# Patient Record
Sex: Male | Born: 1960 | Race: White | Hispanic: No | Marital: Single | Smoking: Never smoker
Health system: Southern US, Community
[De-identification: ages and names within clinical notes are randomized; demographics above are authoritative.]

## PROBLEM LIST (undated history)

## (undated) DIAGNOSIS — F329 Major depressive disorder, single episode, unspecified: Secondary | ICD-10-CM

## (undated) DIAGNOSIS — R55 Syncope and collapse: Secondary | ICD-10-CM

## (undated) DIAGNOSIS — G2581 Restless legs syndrome: Secondary | ICD-10-CM

## (undated) DIAGNOSIS — R202 Paresthesia of skin: Secondary | ICD-10-CM

## (undated) DIAGNOSIS — G479 Sleep disorder, unspecified: Secondary | ICD-10-CM

## (undated) DIAGNOSIS — I10 Essential (primary) hypertension: Secondary | ICD-10-CM

## (undated) DIAGNOSIS — F32A Depression, unspecified: Secondary | ICD-10-CM

## (undated) DIAGNOSIS — F419 Anxiety disorder, unspecified: Secondary | ICD-10-CM

## (undated) HISTORY — PX: TOTAL HIP ARTHROPLASTY: SHX124

## (undated) HISTORY — DX: Anxiety disorder, unspecified: F41.9

## (undated) HISTORY — DX: Paresthesia of skin: R20.2

## (undated) HISTORY — DX: Sleep disorder, unspecified: G47.9

---

## 1988-09-12 HISTORY — PX: KNEE RECONSTRUCTION: SHX5883

## 1999-09-11 ENCOUNTER — Encounter: Admission: RE | Admit: 1999-09-11 | Discharge: 1999-09-11 | Payer: Self-pay | Admitting: Orthopedic Surgery

## 1999-09-11 ENCOUNTER — Encounter: Payer: Self-pay | Admitting: Orthopedic Surgery

## 2003-04-19 ENCOUNTER — Ambulatory Visit (HOSPITAL_COMMUNITY): Admission: RE | Admit: 2003-04-19 | Discharge: 2003-04-19 | Payer: Self-pay | Admitting: Family Medicine

## 2006-09-27 ENCOUNTER — Ambulatory Visit: Payer: Self-pay | Admitting: Internal Medicine

## 2006-09-27 LAB — CONVERTED CEMR LAB
Alkaline Phosphatase: 95 units/L (ref 39–117)
BUN: 11 mg/dL (ref 6–23)
CO2: 23 meq/L (ref 19–32)
Creatinine, Ser: 1.02 mg/dL (ref 0.40–1.50)
Glucose, Bld: 84 mg/dL (ref 70–99)
LDL Cholesterol: 158 mg/dL — ABNORMAL HIGH (ref 0–99)
Potassium: 4.5 meq/L (ref 3.5–5.3)
Sodium: 143 meq/L (ref 135–145)
Total CHOL/HDL Ratio: 4.3
VLDL: 38 mg/dL (ref 0–40)

## 2006-09-28 ENCOUNTER — Ambulatory Visit (HOSPITAL_COMMUNITY): Admission: RE | Admit: 2006-09-28 | Discharge: 2006-09-28 | Payer: Self-pay | Admitting: Internal Medicine

## 2006-10-13 ENCOUNTER — Ambulatory Visit: Payer: Self-pay | Admitting: *Deleted

## 2006-10-13 ENCOUNTER — Ambulatory Visit: Payer: Self-pay | Admitting: Internal Medicine

## 2006-11-25 ENCOUNTER — Ambulatory Visit: Payer: Self-pay | Admitting: Internal Medicine

## 2006-12-07 ENCOUNTER — Ambulatory Visit (HOSPITAL_COMMUNITY): Admission: RE | Admit: 2006-12-07 | Discharge: 2006-12-07 | Payer: Self-pay | Admitting: Internal Medicine

## 2007-01-03 ENCOUNTER — Ambulatory Visit: Payer: Self-pay | Admitting: Internal Medicine

## 2007-01-03 LAB — CONVERTED CEMR LAB
ALT: 64 units/L — ABNORMAL HIGH (ref 0–53)
AST: 43 units/L — ABNORMAL HIGH (ref 0–37)
Albumin: 4.5 g/dL (ref 3.5–5.2)
Alkaline Phosphatase: 91 units/L (ref 39–117)
Basophils Relative: 1 % (ref 0–1)
CO2: 24 meq/L (ref 19–32)
Calcium: 9.6 mg/dL (ref 8.4–10.5)
Creatinine, Ser: 1.06 mg/dL (ref 0.40–1.50)
Eosinophils Absolute: 0.1 10*3/uL — ABNORMAL LOW (ref 0.2–0.7)
Eosinophils Relative: 1 % (ref 0–5)
HCT: 47.5 % (ref 39.0–52.0)
Hemoglobin: 15.9 g/dL (ref 13.0–17.0)
Lymphocytes Relative: 31 % (ref 12–46)
Monocytes Absolute: 0.6 10*3/uL (ref 0.1–1.0)
Monocytes Relative: 13 % — ABNORMAL HIGH (ref 3–12)
Neutro Abs: 2.4 10*3/uL (ref 1.7–7.7)
Potassium: 4.5 meq/L (ref 3.5–5.3)
RBC: 4.76 M/uL (ref 4.22–5.81)
TSH: 3.613 microintl units/mL (ref 0.350–5.50)

## 2007-02-08 ENCOUNTER — Ambulatory Visit (HOSPITAL_COMMUNITY): Admission: RE | Admit: 2007-02-08 | Discharge: 2007-02-08 | Payer: Self-pay | Admitting: Family Medicine

## 2007-03-01 ENCOUNTER — Ambulatory Visit: Payer: Self-pay | Admitting: Internal Medicine

## 2007-04-06 ENCOUNTER — Ambulatory Visit: Payer: Self-pay | Admitting: Internal Medicine

## 2007-04-14 ENCOUNTER — Encounter: Admission: RE | Admit: 2007-04-14 | Discharge: 2007-04-14 | Payer: Self-pay | Admitting: Internal Medicine

## 2007-05-03 ENCOUNTER — Encounter: Admission: RE | Admit: 2007-05-03 | Discharge: 2007-05-03 | Payer: Self-pay | Admitting: Internal Medicine

## 2007-05-16 ENCOUNTER — Encounter: Admission: RE | Admit: 2007-05-16 | Discharge: 2007-05-16 | Payer: Self-pay | Admitting: Internal Medicine

## 2007-06-01 ENCOUNTER — Ambulatory Visit: Payer: Self-pay | Admitting: Internal Medicine

## 2007-08-05 ENCOUNTER — Inpatient Hospital Stay (HOSPITAL_COMMUNITY): Admission: RE | Admit: 2007-08-05 | Discharge: 2007-08-08 | Payer: Self-pay | Admitting: Orthopedic Surgery

## 2007-10-20 ENCOUNTER — Emergency Department (HOSPITAL_COMMUNITY): Admission: EM | Admit: 2007-10-20 | Discharge: 2007-10-20 | Payer: Self-pay | Admitting: Emergency Medicine

## 2009-03-01 ENCOUNTER — Encounter: Admission: RE | Admit: 2009-03-01 | Discharge: 2009-03-01 | Payer: Self-pay | Admitting: Orthopedic Surgery

## 2009-11-12 ENCOUNTER — Ambulatory Visit (HOSPITAL_COMMUNITY): Admission: RE | Admit: 2009-11-12 | Discharge: 2009-11-12 | Payer: Self-pay | Admitting: Family Medicine

## 2009-11-22 ENCOUNTER — Encounter (INDEPENDENT_AMBULATORY_CARE_PROVIDER_SITE_OTHER): Payer: Self-pay | Admitting: *Deleted

## 2009-11-22 LAB — CONVERTED CEMR LAB
ALT: 55 units/L — ABNORMAL HIGH (ref 0–53)
AST: 42 units/L — ABNORMAL HIGH (ref 0–37)
Alkaline Phosphatase: 82 units/L (ref 39–117)
Cholesterol: 256 mg/dL — ABNORMAL HIGH (ref 0–200)
Cocaine Metabolites: NEGATIVE
Creatinine, Ser: 0.95 mg/dL (ref 0.40–1.50)
Creatinine,U: 167.1 mg/dL
LDL Cholesterol: 171 mg/dL — ABNORMAL HIGH (ref 0–99)
Methadone: NEGATIVE
Propoxyphene: NEGATIVE
Sodium: 142 meq/L (ref 135–145)
Total Bilirubin: 0.4 mg/dL (ref 0.3–1.2)
Total CHOL/HDL Ratio: 4.7
VLDL: 31 mg/dL (ref 0–40)

## 2009-11-28 ENCOUNTER — Encounter (INDEPENDENT_AMBULATORY_CARE_PROVIDER_SITE_OTHER): Payer: Self-pay | Admitting: Internal Medicine

## 2009-11-28 LAB — CONVERTED CEMR LAB
HCV Ab: NEGATIVE
Hep B Core Total Ab: NEGATIVE

## 2009-12-01 ENCOUNTER — Encounter: Admission: RE | Admit: 2009-12-01 | Discharge: 2009-12-01 | Payer: Self-pay | Admitting: Family Medicine

## 2009-12-18 ENCOUNTER — Ambulatory Visit (HOSPITAL_COMMUNITY)
Admission: RE | Admit: 2009-12-18 | Discharge: 2009-12-18 | Payer: Self-pay | Source: Home / Self Care | Attending: Family Medicine | Admitting: Family Medicine

## 2010-02-02 ENCOUNTER — Encounter: Payer: Self-pay | Admitting: Internal Medicine

## 2010-02-06 ENCOUNTER — Encounter (INDEPENDENT_AMBULATORY_CARE_PROVIDER_SITE_OTHER): Payer: Self-pay | Admitting: *Deleted

## 2010-02-06 LAB — CONVERTED CEMR LAB
AST: 27 units/L (ref 0–37)
Albumin: 4.5 g/dL (ref 3.5–5.2)
Alkaline Phosphatase: 85 units/L (ref 39–117)
Bilirubin, Direct: 0.1 mg/dL (ref 0.0–0.3)
Indirect Bilirubin: 0.5 mg/dL (ref 0.0–0.9)
Total Bilirubin: 0.6 mg/dL (ref 0.3–1.2)

## 2010-04-22 ENCOUNTER — Ambulatory Visit: Payer: Self-pay | Admitting: *Deleted

## 2010-05-27 NOTE — Op Note (Signed)
**Note Micheal Hunter** [name], [name]               ACCOUNT NO.:  [id number]   MEDICAL RECORD NO.:  [id number]          PATIENT TYPE:  INP   LOCATION:  0001                         FACILITY:  [facility]   PHYSICIAN:  Madlyn Frankel. Charlann Boxer, M.D.  DATE OF BIRTH:  July 09, 1960   DATE OF PROCEDURE:  08/05/2007  DATE OF DISCHARGE:                               OPERATIVE REPORT   PREOPERATIVE DIAGNOSIS:  Right hip avascular necrosis.   POSTOPERATIVE DIAGNOSIS:  Right hip avascular necrosis.   PROCEDURE:  Right total hip replacement.   COMPONENTS USED:  DePuy hip system size 52 ASR 6 high Trilock stem with  a 46 +2 ASR ball and adapter.   SURGEON:  Madlyn Frankel. Charlann Boxer, M.D.   ASSISTANT:  Yetta Glassman. Loreta Ave, PA   ANESTHESIA:  General.   BLOOD LOSS:  400.   DRAINS:  x1.   COMPLICATIONS:  None.   INDICATIONS FOR PROCEDURE:  Mr. Micheal Hunter is a 50 year old gentleman who  presented with a 2 year history of increasing worsening right hip pain  with severe radiographic changes consistent with avascular necrosis.  An  MRI had been performed outside and confirmed this diagnosis and also  indicating stage 1 AVN on the left which was currently asymptomatic.  After reviewing this pathology surgical options were reviewed.  Consent  was obtained for the benefit of pain relief.  After reviewing  significant risks of infection, DVT, component failure, dislocation and  need for revision as well as his age and bearing surface and options  available consent was obtained.   PROCEDURE IN DETAIL:  The patient was brought to the operative theater.  Once adequate anesthesia preoperative antibiotics, Ancef 2 grams, were  administered.  The patient was positioned in the left lateral decubitus  position right side up.  The right lower extremity was subsequently pre  scrubbed and prepped and draped in a sterile fashion.  Lateral based  incision was made for posterior approach to the hip.  The iliotibial  band and gluteal fascia incised  posteriorly.  The short external  rotators were identified and taken down separately from the posterior  capsule.  An L capsulotomy was made exposing the hip joint.  The hip was  dislocated.  Severe avascular changes were noted with collapse of the  femoral head and flattening.  Neck osteotomy was made in the  trochanteric fossa.  Retractors were placed and attention directed first  to the femur.  Using a box osteotome I cleared out the lateral neck and  in varus position then used a hand reamer and then irrigated the canal  to prevent fat emboli.  I then began broaching with a size 1 broach  setting my anteversion at 20 degrees.  I broached all the way up to a  size 5 initially and used a calcar planer to plane off the medial neck.  I packed off the femur attendant to the acetabulum.   Retractors were placed.  Significant synovitis was debrided.  Labrum  removed including the significant posterolateral osteophyte helping to  identify boundaries of the acetabulum.  I then began reaming with a  46  reamer down to the medial wall and sequentially reamed up to a 51 reamer  with excellent bony bed preparation.  I marked and identified the  anterior aspect of the acetabulum for landmarks and placement of final  cup.  A final 52 ASR cup with a curved impactor was subsequently  impacted into position at approximately 4 degrees of abduction using the  guide on the impactor.  Once I impacted at appropriate level based on  the identification marking anteriorly we then checked rechecked the  guide to confirm position.  Attention was now directed to a trial  reduction.   The size 5 broach was placed with a +2 adapter and 46 ball.  Hip  stability was very good, tolerated internal rotation to 70 degrees and  neutral abduction and flexion to 80 degrees in addition to the sleep  position with the leg abducted and internally rotated.  There were a few  millimeters of shuck.  For this reason I went ahead  and removed the  trial components and broached to a size 6 which the level of my neck cut  had been made whereas the 5 broach just had a couple of millimeters  subsided.  We chose the final 6 high Trilock stem and impacted this to  the level where the broach sat.  I retrialed the +2 adapter.  With this,  there was only a millimeter of  shuck in extension.  The leg lengths  appeared to be comparable and range of motion of hip still remained  excellent.   The final 46 ball and +2 adapter were impacted on the back table and  then impacted onto a dry trunnion.  Hip was irrigated.  The hip reduced.  We irrigated the hip throughout the case again at this point.  I did not  reapproximate the posterior capsule due to debridement necessary for the  osteophytes present.  I reapproximated the iliotibial band and gluteal  fascia over a medium Hemovac drain.  The remaining wound was closed with  2-0 Vicryl and running 4-0 Monocryl.  The hip was cleaned, dried and  dressed sterilely with Steri-Strips and sterile dressing and he was  brought to the recovery room extubated and in stable condition.      Madlyn Frankel Charlann Boxer, M.D.  Electronically Signed     MDO/MEDQ  D:  08/05/2007  T:  08/05/2007  Job:  [id number]

## 2010-05-27 NOTE — H&P (Signed)
Micheal Hunter, Micheal Hunter               ACCOUNT NO.:  [id number]   MEDICAL RECORD NO.:  [id number]          PATIENT TYPE:  INP   LOCATION:  NA                           FACILITY:  [facility]   PHYSICIAN:  Madlyn Frankel. Charlann Boxer, M.D.  DATE OF BIRTH:  Oct 28, 1960   DATE OF ADMISSION:  08/05/2007  DATE OF DISCHARGE:                              HISTORY & PHYSICAL   PROCEDURE:  Will be a right total hip arthroplasty.   CHIEF COMPLAINTS:  Right hip pain.   HISTORY OF PRESENT ILLNESS:  A 50 year old male with a history of right  hip pain secondary to AVN stage IV.  It has been refractory to all  conservative treatment.  He has had a diminished quality of life,  intractable pain, and has to walk with assistance.  He has been followed  and provided vocational rehab through the assistance by Dr. Reche Dixon.   PAST MEDICAL HISTORY:  Significant for AVN.   SURGICAL HISTORY:  Right knee scope by Dr. Rennis Chris in the past.   FAMILY HISTORY:  Noncontributory.   SOCIAL HISTORY:  He is divorced but does have a primary caregiver lined  up at home postoperatively.   DRUG ALLERGIES:  No known drug allergies.   MEDICATIONS:  None.   REVIEW OF SYSTEMS:  GENERAL:  He has increase in weight recently.  DERMATOLOGY:  He has itching that he has on his left arm.  GENITOURINARY:  Has issues with urinary frequency, urinating at night.  MUSCULOSKELETAL:  Has joint pain, back pain and morning stiffness.  Otherwise see HPI.   PHYSICAL EXAMINATION:  This is a 6 feet, 285-pound male.  VITAL SIGNS:  Pulse 72, respirations 16, blood pressure 148/100.  GENERAL:  Awake, alert and oriented, well-developed, well-nourished, no  acute distress.  NECK:  Supple.  No carotid bruits.  CHEST/LUNGS:  Clear to auscultation bilaterally.  BREASTS:  Deferred.  HEART:  Regular rate and rhythm.  S1, S2 distinct.  ABDOMEN:  Soft, nontender, nondistended.  Bowel sounds present.  GENITOURINARY:  Deferred.  EXTREMITIES:  Right hip has decreased  range of motion and increased  pain.  SKIN:  No cellulitis of the right hip.  He does have a rash on his left  arm where he has scratched the top layer of epidermis off.  NEUROLOGIC:  Intact distal sensibilities.   LABS:  EKG, chest x-ray all pending, presurgical testing.   IMPRESSION:  Right hip AVN stage IV.   PLAN OF ACTION:  Right total hip arthroplasty at [facility]  August 05, 2007 by surgeon Dr. Durene Romans.  Risks and complications were  discussed.   Postoperative medications including Lovenox, Robaxin, iron, aspirin,  Colace, MiraLax provided at time of history of physical.  Pain medicine  will be provided at the time of surgery.     ______________________________  Yetta Glassman Loreta Ave, [place]      Madlyn Frankel. Charlann Boxer, M.D.  Electronically Signed    BLM/MEDQ  D:  08/01/2007  T:  08/01/2007  Job:  [id number]   cc:   Dineen Kid. Reche Dixon, M.D.  Fax: [phone]

## 2010-05-30 NOTE — Discharge Summary (Signed)
NAMELENIS, Micheal Hunter               ACCOUNT NO.:  [id number]   MEDICAL RECORD NO.:  [id number]          PATIENT TYPE:  INP   LOCATION:  1602                         FACILITY:  [facility]   PHYSICIAN:  Madlyn Frankel. Charlann Boxer, M.D.  DATE OF BIRTH:  September 02, 1960   DATE OF ADMISSION:  08/05/2007  DATE OF DISCHARGE:  08/08/2007                               DISCHARGE SUMMARY   ADMITTING DIAGNOSES:  1. Avascular necrosis.  2. Osteoarthritis.   DISCHARGE DIAGNOSES:  1. Avascular necrosis.  2. Osteoarthritis.   HISTORY OF PRESENT ILLNESS:  A 50 year old male with a history of right  hip pain secondary to avascular necrosis, stage IV.  Refractory to all  conservative treatment.  Diminished quality of life, intractable pain,  difficulty with ambulation.  Presurgically assessed by Dr. Reche Dixon.   CONSULTATION:  None.   PROCEDURE:  Was right total hip arthroplasty by surgeon Dr. Durene Romans.  Assistant Dwyane Luo, PA-C.   LABORATORY DATA:  Upon admission, CBC:  Hemoglobin 15.7, hematocrit  44.9, platelets 138; at time of discharge, hemoglobin 11.8, hematocrit  33.8, platelets 117.  White cell differential all within normal limits.  Coagulation:  His PTT was 22; otherwise, all others normal.  Routine  chemistry upon admission:  No significant abnormalities.  At the time of  discharge, his sodium was 135, potassium 3.7, glucose 149, BUN 4,  creatinine 0.8.  Kidney function all normal.  Calcium 8.4 at time of  discharge.  UA was negative.   CARDIOLOGY:  EKG showed normal sinus rhythm.   RADIOLOGY:  Portable pelvis postoperatively showed severe osteonecrosis  of the right femoral head, secondary to osteoarthritis and slight  remodeling of the acetabulum.   No chest x-ray found on chart.   HOSPITAL COURSE:  The patient underwent a right total hip placement,  admitted to orthopedic floor.  The patient did well while in the  hospital.  His stay was unremarkable.  He remained afebrile,  hemodynamically  stable.  Hemovac was discontinued on day 1.  Left hip  wound showed no significant drainage with dressing changes on a daily  basis.  He made good progress with his physical therapy, occupational  therapy, and prior to discharge he met all functional criteria for home  health care PT prior to discharge.   Seen on day 3, was doing well, afebrile.  Dressing was dry.  Neurovascular stable on this right lower extremity.   DISCHARGE DISPOSITION:  Discharged home with home health care physical  therapy in stable and improved condition.   DISCHARGE PHYSICAL THERAPY:  Weightbearing as tolerated with use of  rolling walker.   DISCHARGE DIET:  Regular.   DISCHARGE WOUND CARE:  Keep dry.   DISCHARGE MEDICATIONS:  1. Lovenox 40 mg subcutaneous q.24h. for 11 days.  2. Robaxin 500 mg p.o. q.6h.  3. Iron 325 mg 1 p.o. t.i.d. for 3 weeks.  4. Colace 100 mg p.o. daily.  5. MiraLax 17 grams p.o. daily.  6. Enteric-coated aspirin 325 mg 1 p.o. daily for 4 weeks after      Lovenox completed.  7. Norco 7.5/325 one  to two p.o. q.4-6h. p.r.n. pain.   DISCHARGE FOLLOWUP:  Follow up with Dr. Charlann Boxer at phone number [phone] in  2 weeks for wound check.     ______________________________  Yetta Glassman. Loreta Ave, [place]      Madlyn Frankel. Charlann Boxer, M.D.  Electronically Signed    BLM/MEDQ  D:  08/30/2007  T:  08/30/2007  Job:  [id number]   cc:   Dineen Kid. Reche Dixon, M.D.  Fax: [phone]

## 2010-10-10 LAB — BASIC METABOLIC PANEL
BUN: 11
BUN: 3 — ABNORMAL LOW
BUN: 4 — ABNORMAL LOW
CO2: 30
CO2: 31
Calcium: 8.1 — ABNORMAL LOW
Calcium: 8.4
Calcium: 9.5
Chloride: 100
Creatinine, Ser: 0.8
GFR calc Af Amer: >60
GFR calc non Af Amer: >60
GFR calc non Af Amer: >60
GFR calc non Af Amer: >60
Glucose, Bld: 118 — ABNORMAL HIGH
Glucose, Bld: 149 — ABNORMAL HIGH
Sodium: 135

## 2010-10-10 LAB — URINALYSIS, ROUTINE W REFLEX MICROSCOPIC
Glucose, UA: NEGATIVE
Hgb urine dipstick: NEGATIVE
Ketones, ur: NEGATIVE
Nitrite: NEGATIVE

## 2010-10-10 LAB — DIFFERENTIAL
Basophils Relative: 1
Eosinophils Absolute: 0.1
Lymphs Abs: 1.1
Monocytes Absolute: 0.4
Monocytes Relative: 10

## 2010-10-10 LAB — CBC
HCT: 36.1 — ABNORMAL LOW
HCT: 44.9
Hemoglobin: 12.4 — ABNORMAL LOW
MCHC: 34.4
MCHC: 34.8
MCV: 98.1
MCV: 99.4
Platelets: 117 — ABNORMAL LOW
RBC: 4.58
RDW: 13.2
RDW: 13.9

## 2010-10-10 LAB — ABO/RH: ABO/RH(D): A POS

## 2012-11-03 ENCOUNTER — Emergency Department (HOSPITAL_COMMUNITY): Payer: Medicare Other

## 2012-11-03 ENCOUNTER — Encounter (HOSPITAL_COMMUNITY): Payer: Self-pay | Admitting: Emergency Medicine

## 2012-11-03 ENCOUNTER — Emergency Department (HOSPITAL_COMMUNITY)
Admission: EM | Admit: 2012-11-03 | Discharge: 2012-11-03 | Disposition: A | Discharge status: Home / Self Care | Payer: Medicare Other | Attending: Emergency Medicine | Admitting: Emergency Medicine

## 2012-11-03 DIAGNOSIS — Z8669 Personal history of other diseases of the nervous system and sense organs: Secondary | ICD-10-CM | POA: Insufficient documentation

## 2012-11-03 DIAGNOSIS — M25519 Pain in unspecified shoulder: Secondary | ICD-10-CM | POA: Insufficient documentation

## 2012-11-03 DIAGNOSIS — E860 Dehydration: Secondary | ICD-10-CM | POA: Insufficient documentation

## 2012-11-03 DIAGNOSIS — R55 Syncope and collapse: Secondary | ICD-10-CM | POA: Insufficient documentation

## 2012-11-03 DIAGNOSIS — M542 Cervicalgia: Secondary | ICD-10-CM | POA: Insufficient documentation

## 2012-11-03 DIAGNOSIS — E871 Hypo-osmolality and hyponatremia: Secondary | ICD-10-CM | POA: Insufficient documentation

## 2012-11-03 DIAGNOSIS — I10 Essential (primary) hypertension: Secondary | ICD-10-CM | POA: Insufficient documentation

## 2012-11-03 HISTORY — DX: Essential (primary) hypertension: I10

## 2012-11-03 HISTORY — DX: Restless legs syndrome: G25.81

## 2012-11-03 LAB — POCT I-STAT TROPONIN I

## 2012-11-03 LAB — BASIC METABOLIC PANEL
CO2: 23 mEq/L (ref 19–32)
Calcium: 8 mg/dL — ABNORMAL LOW (ref 8.4–10.5)
Glucose, Bld: 110 mg/dL — ABNORMAL HIGH (ref 70–99)
Potassium: 4.2 mEq/L (ref 3.5–5.1)
Sodium: 127 mEq/L — ABNORMAL LOW (ref 135–145)

## 2012-11-03 LAB — CBC
Hemoglobin: 14.4 g/dL (ref 13.0–17.0)
Platelets: 80 10*3/uL — ABNORMAL LOW (ref 150–400)
RBC: 4.25 MIL/uL (ref 4.22–5.81)
WBC: 5.9 10*3/uL (ref 4.0–10.5)

## 2012-11-03 LAB — GLUCOSE, CAPILLARY: Glucose-Capillary: 113 mg/dL — ABNORMAL HIGH (ref 70–99)

## 2012-11-03 MED ORDER — SODIUM CHLORIDE 0.9 % IV BOLUS (SEPSIS)
1000.0000 mL | Freq: Once | INTRAVENOUS | Status: AC
  Administered 2012-11-03: 1000 mL via INTRAVENOUS

## 2012-11-03 NOTE — ED Provider Notes (Signed)
CSN: [id number]     Arrival date & time 11/03/12  1835 History   First MD Initiated Contact with Patient 11/03/12 1843     Chief Complaint  Patient presents with  . Loss of Consciousness    Patient is a 52 y.o. male presenting with syncope. The history is provided by the patient.  Loss of Consciousness Episode history:  Multiple Most recent episode:  Today Duration:  1 minute Progression:  Resolved Chronicity:  New Witnessed: yes   Relieved by:  Lying down Worsened by:  Posture Associated symptoms: no chest pain, no difficulty breathing, no fever, no focal sensory loss, no focal weakness, no headaches, no rectal bleeding, no seizures, no shortness of breath, no vomiting and no weakness   pt reports he was eating dinner (cheezeburger) stood up and passed out.  It is reported he was out for one minute and had urinary incontinence.  No seizure reported.  No tongue biting.  No excessive etoh/drug abuse.  No HA.  No head injury.  No neck or back pain. No chest pain No h/o CAD/CHF No abd pain No vomiitng/diarrhea He reports just prior to episode he had mild neck and shoulder pain that is something he has had before No weakness/dizziness upon head movement However upon sitting up he had two more syncopal episodes  Past Medical History  Diagnosis Date  . Hypertension   . Restless leg    Past Surgical History  Procedure Laterality Date  . Total hip arthroplasty Right   . Knee reconstruction Right    No family history on file. History  Substance Use Topics  . Smoking status: Never Smoker   . Smokeless tobacco: Never Used  . Alcohol Use: 6.0 oz/week    10 Cans of beer per week     Comment: I have a couple of beers a day    Review of Systems  Constitutional: Negative for fever.  Respiratory: Negative for shortness of breath.   Cardiovascular: Positive for syncope. Negative for chest pain.  Gastrointestinal: Negative for vomiting, diarrhea and blood in stool.  Neurological:  Positive for syncope. Negative for focal weakness, seizures, weakness and headaches.  All other systems reviewed and are negative.    Allergies  Review of patient's allergies indicates no known allergies.  Home Medications  No current outpatient prescriptions on file. BP 101/61  Pulse 76  Temp(Src) 97.9 F (36.6 C) (Oral)  Resp 18  Ht 6' (1.829 m)  Wt 300 lb (136.079 kg)  BMI 40.68 kg/m2  SpO2 98% BP 131/89  Pulse 81  Temp(Src) 97.9 F (36.6 C) (Oral)  Resp 18  Ht 6' (1.829 m)  Wt 300 lb (136.079 kg)  BMI 40.68 kg/m2  SpO2 98%  Physical Exam CONSTITUTIONAL: Well developed/well nourished HEAD: Normocephalic/atraumatic, no signs of trauma or tenderness EYES: EOMI/PERRL ENMT: Mucous membranes moist, No evidence of facial/nasal trauma No tongue laceration NECK: supple no meningeal signs, NO BRUITS NOTED SPINE:entire spine nontender, No bruising/crepitance/stepoffs noted to spine CV: S1/S2 noted, no murmurs/rubs/gallops noted LUNGS: Lungs are clear to auscultation bilaterally, no apparent distress ABDOMEN: soft, nontender, no rebound or guarding GU:no cva tenderness NEURO: Pt is awake/alert, moves all extremitiesx4, GCS 15, no arm/leg weakness.  No facial droop EXTREMITIES: pulses normal, full ROM, All extremities/joints palpated/ranged and nontender SKIN: warm, color normal PSYCH: no abnormalities of mood noted  ED Course  Procedures  Labs Review Labs Reviewed  GLUCOSE, CAPILLARY - Abnormal; Notable for the following:    Glucose-Capillary 113 (*)  All other components within normal limits  CBC  BASIC METABOLIC PANEL     EKG Interpretation     Ventricular Rate:  78 PR Interval:  172 QRS Duration: 121 QT Interval:  488 QTC Calculation: 556 R Axis:   -10 Text Interpretation:  Sinus rhythm Nonspecific intraventricular conduction delay Anteroseptal infarct, age indeterminate nml intervals Non-specific ST-t changes             7:26 PM Pt with  unprovoked syncopal episode Denies new medications He had otherwise been feeling well Continue to monitor and will give IV fluids 8:45 PM VITALS ARE IMPROVING, PT STABLE   Pt observed for several hours He felt improved, he was ambulatory and his vitals improved He is dehydrated.  Suspect some of this is from his HTN meds He continues to deny CP/SOB I did discuss possibility of admission for observation and would benefit from tele monitoring and also cardiac echo. He preferred to go home and f/u with PCP.  He will stop his HCTZ for now.  We discussed strict return precautions MDM  No diagnosis found. Nursing notes including past medical history and social history reviewed and considered in documentation Labs/vital reviewed and considered     Joya Gaskins, MD 11/04/12 0011

## 2012-11-03 NOTE — ED Notes (Signed)
Phlebotomy at bedside.

## 2012-11-03 NOTE — ED Notes (Signed)
Pt was able to ambulate with no issues 

## 2012-11-03 NOTE — ED Notes (Signed)
Lab and Phlebotomy contacted regarding delay in troponin result. EDP and Pt updated

## 2012-11-03 NOTE — ED Notes (Signed)
Pt with steady gait, ambulatory to waiting room with this RN. Pt provided with telephone to contact a family member for a ride home.

## 2012-11-03 NOTE — ED Notes (Signed)
Dr. Wickline at bedside.  

## 2012-11-03 NOTE — ED Notes (Addendum)
Pt brought from home via EMS for syncopal episodes. Pt had syncopal episode with incontinence. EMS arrived on scene and initial BP of 80/50, upon sitting pt up x2 pt had 2 more syncopal episodes. Pt given 2L [place] PTA and placed on 2L [place]. Pt not on [place] at this time and SpO2 of 99%

## 2012-11-03 NOTE — ED Notes (Signed)
Unable to obtain lab draw. Phlebotomy notified

## 2012-12-19 ENCOUNTER — Encounter: Payer: Self-pay | Admitting: Neurology

## 2012-12-20 ENCOUNTER — Ambulatory Visit (INDEPENDENT_AMBULATORY_CARE_PROVIDER_SITE_OTHER): Payer: Medicare Other | Admitting: Neurology

## 2012-12-20 ENCOUNTER — Encounter (INDEPENDENT_AMBULATORY_CARE_PROVIDER_SITE_OTHER): Payer: Self-pay

## 2012-12-20 ENCOUNTER — Encounter: Payer: Self-pay | Admitting: Neurology

## 2012-12-20 ENCOUNTER — Telehealth: Payer: Self-pay | Admitting: Neurology

## 2012-12-20 DIAGNOSIS — R209 Unspecified disturbances of skin sensation: Secondary | ICD-10-CM

## 2012-12-20 DIAGNOSIS — G609 Hereditary and idiopathic neuropathy, unspecified: Secondary | ICD-10-CM | POA: Insufficient documentation

## 2012-12-20 DIAGNOSIS — G4733 Obstructive sleep apnea (adult) (pediatric): Secondary | ICD-10-CM

## 2012-12-20 DIAGNOSIS — R200 Anesthesia of skin: Secondary | ICD-10-CM | POA: Insufficient documentation

## 2012-12-20 NOTE — Telephone Encounter (Signed)
Left message for patient to call and schedule Nerve conduction study and EMG, and to tell him to come in and get labs done before this test.

## 2012-12-20 NOTE — Progress Notes (Signed)
[facility]  PATIENT: Micheal Hunter DOB: 05/22/1960  HISTORICAL Micheal Hunter is a 52 years old right-handed Caucasian male, referred by his primary care physician Dr. Lenise Arena for evaluation of bilateral feet paresthesia  He had a past medical history of obesity, hypertension, hyperlipidemia, anxiety, previous right hip replacement, he reported 50 pound weight gain over the past 2 years  Since January 2014, he began to notice distal feet, plantar feet, toes numbness tingling, getting worse over the past one year, now involves whole bottom of his feet, he has gait difficulty because of lack of coordination, he has mild low back pain, no shooting pain from back to his feet  He denies bilateral hands paresthesia, about 3 weeks ago, after eating supper at his parents house, he goes out, without warning signs, he passed out, was reported to have low blood pressure 60/0, when the paramedic initially arrived, he also has urinary incontinence, but there was no seizure-like activity.  Most recent laboratory from December third 2014 has demonstrated normal CMP, LDL was elevated 165, normal TSH,   REVIEW OF SYSTEMS: Full 14 system review of systems performed and notable only for fatigue, blurred vision, snoring, feeling hot, runny nose, numbness, dizziness, passing out, insomnia, snoring, restless legs, anxiety, not enough sleep, decreased energy, disinterested in activities.  ALLERGIES: No Known Allergies  HOME MEDICATIONS: Outpatient Prescriptions Prior to Visit  Medication Sig Dispense Refill  . clonazePAM (KLONOPIN) 0.5 MG tablet Take 0.5 mg by mouth 2 (two) times daily.      Marland Kitchen lisinopril (PRINIVIL,ZESTRIL) 10 MG tablet Take 10 mg by mouth daily.      Marland Kitchen lisinopril (PRINIVIL,ZESTRIL) 5 MG tablet Take 5 mg by mouth daily.      . sertraline (ZOLOFT) 50 MG tablet Take 50 mg by mouth daily.      . sodium chloride (OCEAN) 0.65 % nasal spray Place 1 spray into the nose as  needed for congestion.      . Triamcinolone Acetonide (NASACORT AQ NA) Place into the nose.      . zolpidem (AMBIEN) 10 MG tablet Take 10 mg by mouth at bedtime as needed for sleep.       No facility-administered medications prior to visit.    PAST MEDICAL HISTORY: Past Medical History  Diagnosis Date  . Hypertension   . Restless leg   . Anxiety   . Paresthesia   . Sleep disturbance     PAST SURGICAL HISTORY: Past Surgical History  Procedure Laterality Date  . Total hip arthroplasty Right   . Knee reconstruction Right     FAMILY HISTORY: No family history on file.  SOCIAL HISTORY:  History   Social History  . Marital Status: Single    Spouse Name: N/A    Number of Children: 3  . Years of Education: 12th   Occupational History  .     Social History Main Topics  . Smoking status: Never Smoker   . Smokeless tobacco: Never Used  . Alcohol Use: 6.0 oz/week    10 Cans of beer per week     Comment: I have a couple of beers a day  . Drug Use: No  . Sexual Activity: Not on file   Other Topics Concern  . Not on file   Social History Narrative   Patient is single.   Patient has three children.   Patient is self-employed. - [employer]   Right handed.   Caffeine-  None  PHYSICAL EXAM   Filed Vitals:   12/20/12 1401  BP: 146/86  Pulse: 84  Height: 6\' 1"  (1.854 m)  Weight: 313 lb (141.976 kg)    Not recorded    Body mass index is 41.3 kg/(m^2).   Generalized: In no acute distress  Neck: Supple, no carotid bruits   Cardiac: Regular rate rhythm  Pulmonary: Clear to auscultation bilaterally  Musculoskeletal: No deformity  Neurological examination  Mentation: Alert oriented to time, place, history taking, and causual conversation  Cranial nerve II-XII: Pupils were equal round reactive to light extraocular movements were full, Visual field were full on confrontational test. Bilateral fundi were sharp.  Facial sensation and strength were  normal. Hearing was intact to finger rubbing bilaterally. Uvula tongue midline.  head turning and shoulder shrug and were normal and symmetric.Tongue protrusion into cheek strength was normal.  Motor: normal tone, bulk and strength.  Sensory: mildly length dependent decreased fine touch, pinprick to ankle level,  Absent vibratory sensation at toes, and proprioception at toes.  Coordination: Normal finger to nose, heel-to-shin bilaterally there was no truncal ataxia  Gait: Rising up from seated position without assistance, normal stance, without trunk ataxia, moderate stride, good arm swing, smooth turning, able to perform tiptoe, he has moderate difficulty performing tandem walking.   Romberg signs: Negative  Deep tendon reflexes: Brachioradialis 2/2, biceps 2/2, triceps 2/2, patellar 2/2, Achilles 1/1, plantar responses were flexor bilaterally.   DIAGNOSTIC DATA (LABS, IMAGING, TESTING) - I reviewed patient records, labs, notes, testing and imaging myself where available.  Lab Results  Component Value Date   WBC 5.9 11/03/2012   HGB 14.4 11/03/2012   HCT 40.1 11/03/2012   MCV 94.4 11/03/2012   PLT 80* 11/03/2012      Component Value Date/Time   NA 127* 11/03/2012 1910   K 4.2 11/03/2012 1910   CL 88* 11/03/2012 1910   CO2 23 11/03/2012 1910   GLUCOSE 110* 11/03/2012 1910   BUN 6 11/03/2012 1910   CREATININE 1.04 11/03/2012 1910   CALCIUM 8.0* 11/03/2012 1910   PROT 7.6 02/06/2010 2249   ALBUMIN 4.5 02/06/2010 2249   AST 27 02/06/2010 2249   ALT 36 02/06/2010 2249   ALKPHOS 85 02/06/2010 2249   BILITOT 0.6 02/06/2010 2249   GFRNONAA 81* 11/03/2012 1910   GFRAA >90 11/03/2012 1910   Lab Results  Component Value Date   CHOL 256* 11/22/2009   HDL 54 11/22/2009   LDLCALC 171* 11/22/2009   TRIG 156* 11/22/2009   CHOLHDL 4.7 Ratio 11/22/2009    Lab Results  Component Value Date   TSH 3.613 01/03/2007     ASSESSMENT AND PLAN   52 yo RH WM with PMhx of obesity, weight  gain, snoring, now presenting with bilateral feet paresthesia.  1. Most suggestive of peripheral neuropathy. 2. EMG/NCS. 3. He has symptoms of obstructive sleep apnea.ESS 10, FSS 28, order sleep study 4.  Lab evaluation for etiology of his peripheral neuropathy.   Levert Feinstein, M.D. Ph.D.  [facility] [address] [place], [place] [address] [phone]

## 2012-12-22 ENCOUNTER — Telehealth: Payer: Self-pay | Admitting: Neurology

## 2012-12-28 ENCOUNTER — Telehealth: Payer: Self-pay | Admitting: Neurology

## 2012-12-28 DIAGNOSIS — R0683 Snoring: Secondary | ICD-10-CM

## 2012-12-28 NOTE — Telephone Encounter (Signed)
Refers patient for attended sleep study: Dr. Terrace Arabia   Height: 6.1"  Weight: 313 lbs.  BMI: 41.30  Past Medical History: Hypertension,Restless leg,Anxiety,Paresthesia, Sleep disturbance    Sleep Symptoms: fatigue, snoring, restless legs,not enough sleep   Epworth Score:  10 and FSS 28  Medications:  ClonazePAM (Tab) KLONOPIN 0.5 MG Take 0.5 mg by mouth 2 (two) times daily. Lisinopril (Tab) PRINIVIL,ZESTRIL 5 MG Take 5 mg by mouth daily. Lisinopril (Tab) PRINIVIL,ZESTRIL 10 MG Take 10 mg by mouth daily. Saline (Solution) OCEAN 0.65 % Place 1 spray into the nose as needed for congestion. Sertraline HCl (Tab) ZOLOFT 50 MG Take 50 mg by mouth daily. Triamcinolone Acetonide NASACORT AQ NA Place into the nose. Zolpidem Tartrate (Tab) AMBIEN 10 MG Take 10 mg by mouth at bedtime as needed for sleep.   Insurance: Medicare  Please review patient information and submit instructions for scheduling and orders for sleep technologist.  Thank you!

## 2012-12-29 NOTE — Telephone Encounter (Signed)
I called and left a message for the patient to callback to the office to schedule his sleep study.  

## 2012-12-30 ENCOUNTER — Encounter (HOSPITAL_BASED_OUTPATIENT_CLINIC_OR_DEPARTMENT_OTHER): Payer: Self-pay | Admitting: Emergency Medicine

## 2012-12-30 ENCOUNTER — Observation Stay (HOSPITAL_BASED_OUTPATIENT_CLINIC_OR_DEPARTMENT_OTHER)
Admission: EM | Admit: 2012-12-30 | Discharge: 2012-12-31 | Disposition: A | Discharge status: Home / Self Care | Payer: Medicare Other | Attending: Internal Medicine | Admitting: Internal Medicine

## 2012-12-30 ENCOUNTER — Emergency Department (HOSPITAL_BASED_OUTPATIENT_CLINIC_OR_DEPARTMENT_OTHER): Payer: Medicare Other

## 2012-12-30 DIAGNOSIS — R42 Dizziness and giddiness: Secondary | ICD-10-CM | POA: Insufficient documentation

## 2012-12-30 DIAGNOSIS — G608 Other hereditary and idiopathic neuropathies: Secondary | ICD-10-CM | POA: Insufficient documentation

## 2012-12-30 DIAGNOSIS — Z96649 Presence of unspecified artificial hip joint: Secondary | ICD-10-CM | POA: Insufficient documentation

## 2012-12-30 DIAGNOSIS — R0602 Shortness of breath: Secondary | ICD-10-CM | POA: Insufficient documentation

## 2012-12-30 DIAGNOSIS — R209 Unspecified disturbances of skin sensation: Secondary | ICD-10-CM | POA: Insufficient documentation

## 2012-12-30 DIAGNOSIS — E871 Hypo-osmolality and hyponatremia: Secondary | ICD-10-CM | POA: Insufficient documentation

## 2012-12-30 DIAGNOSIS — I959 Hypotension, unspecified: Secondary | ICD-10-CM

## 2012-12-30 DIAGNOSIS — R55 Syncope and collapse: Principal | ICD-10-CM | POA: Insufficient documentation

## 2012-12-30 DIAGNOSIS — Z23 Encounter for immunization: Secondary | ICD-10-CM | POA: Insufficient documentation

## 2012-12-30 DIAGNOSIS — R200 Anesthesia of skin: Secondary | ICD-10-CM

## 2012-12-30 DIAGNOSIS — I10 Essential (primary) hypertension: Secondary | ICD-10-CM | POA: Insufficient documentation

## 2012-12-30 DIAGNOSIS — G609 Hereditary and idiopathic neuropathy, unspecified: Secondary | ICD-10-CM | POA: Diagnosis present

## 2012-12-30 DIAGNOSIS — G2581 Restless legs syndrome: Secondary | ICD-10-CM | POA: Insufficient documentation

## 2012-12-30 DIAGNOSIS — F411 Generalized anxiety disorder: Secondary | ICD-10-CM | POA: Insufficient documentation

## 2012-12-30 HISTORY — DX: Syncope and collapse: R55

## 2012-12-30 HISTORY — DX: Depression, unspecified: F32.A

## 2012-12-30 HISTORY — DX: Major depressive disorder, single episode, unspecified: F32.9

## 2012-12-30 LAB — COMPREHENSIVE METABOLIC PANEL
ALT: 114 U/L — ABNORMAL HIGH (ref 0–53)
AST: 143 U/L — ABNORMAL HIGH (ref 0–37)
Alkaline Phosphatase: 93 U/L (ref 39–117)
CO2: 21 mEq/L (ref 19–32)
Calcium: 9.7 mg/dL (ref 8.4–10.5)
Creatinine, Ser: 1.3 mg/dL (ref 0.50–1.35)
GFR calc Af Amer: 71 mL/min — ABNORMAL LOW (ref >90)
GFR calc non Af Amer: 61 mL/min — ABNORMAL LOW (ref >90)
Glucose, Bld: 108 mg/dL — ABNORMAL HIGH (ref 70–99)
Potassium: 4.3 mEq/L (ref 3.5–5.1)
Sodium: 133 mEq/L — ABNORMAL LOW (ref 135–145)
Total Protein: 8.3 g/dL (ref 6.0–8.3)

## 2012-12-30 LAB — CBC WITH DIFFERENTIAL/PLATELET
Basophils Absolute: 0 10*3/uL (ref 0.0–0.1)
Eosinophils Relative: 0 % (ref 0–5)
Hemoglobin: 16.1 g/dL (ref 13.0–17.0)
Lymphocytes Relative: 25 % (ref 12–46)
Lymphs Abs: 1.3 10*3/uL (ref 0.7–4.0)
MCV: 99.1 fL (ref 78.0–100.0)
Monocytes Absolute: 0.8 10*3/uL (ref 0.1–1.0)
Neutro Abs: 3.1 10*3/uL (ref 1.7–7.7)
Neutrophils Relative %: 59 % (ref 43–77)
Platelets: 108 10*3/uL — ABNORMAL LOW (ref 150–400)
RBC: 4.55 MIL/uL (ref 4.22–5.81)
RDW: 13.9 % (ref 11.5–15.5)
WBC: 5.2 10*3/uL (ref 4.0–10.5)

## 2012-12-30 LAB — TROPONIN I: Troponin I: <0.3 ng/mL (ref <0.30)

## 2012-12-30 MED ORDER — ONDANSETRON HCL 4 MG/2ML IJ SOLN
4.0000 mg | Freq: Once | INTRAMUSCULAR | Status: AC
  Administered 2012-12-30: 4 mg via INTRAVENOUS

## 2012-12-30 MED ORDER — ONDANSETRON HCL 4 MG/2ML IJ SOLN
INTRAMUSCULAR | Status: AC
  Administered 2012-12-30: 4 mg via INTRAVENOUS
  Filled 2012-12-30: qty 2

## 2012-12-30 MED ORDER — ACETAMINOPHEN 650 MG RE SUPP: 650.0000 mg | Freq: Four times a day (QID) | RECTAL | Status: DC | PRN

## 2012-12-30 MED ORDER — ZOLPIDEM TARTRATE 5 MG PO TABS
5.0000 mg | ORAL_TABLET | Freq: Every evening | ORAL | Status: DC | PRN
  Administered 2012-12-30: 5 mg via ORAL
  Filled 2012-12-30: qty 1

## 2012-12-30 MED ORDER — SODIUM CHLORIDE 0.9 % IV SOLN
INTRAVENOUS | Status: DC
  Administered 2012-12-30: via INTRAVENOUS

## 2012-12-30 MED ORDER — ALUM & MAG HYDROXIDE-SIMETH 200-200-20 MG/5ML PO SUSP: 30.0000 mL | Freq: Four times a day (QID) | ORAL | Status: DC | PRN

## 2012-12-30 MED ORDER — ONDANSETRON HCL 4 MG PO TABS: 4.0000 mg | ORAL_TABLET | Freq: Four times a day (QID) | ORAL | Status: DC | PRN

## 2012-12-30 MED ORDER — ONDANSETRON HCL 4 MG/2ML IJ SOLN: 4.0000 mg | Freq: Four times a day (QID) | INTRAMUSCULAR | Status: DC | PRN

## 2012-12-30 MED ORDER — SODIUM CHLORIDE 0.9 % IJ SOLN
3.0000 mL | Freq: Two times a day (BID) | INTRAMUSCULAR | Status: DC
  Administered 2012-12-30: 3 mL via INTRAVENOUS

## 2012-12-30 MED ORDER — HYDROMORPHONE HCL PF 1 MG/ML IJ SOLN: 0.5000 mg | INTRAMUSCULAR | Status: DC | PRN

## 2012-12-30 MED ORDER — OXYCODONE HCL 5 MG PO TABS: 5.0000 mg | ORAL_TABLET | ORAL | Status: DC | PRN

## 2012-12-30 MED ORDER — ACETAMINOPHEN 325 MG PO TABS
650.0000 mg | ORAL_TABLET | Freq: Four times a day (QID) | ORAL | Status: DC | PRN
  Administered 2012-12-31: 650 mg via ORAL
  Filled 2012-12-30: qty 2

## 2012-12-30 MED ORDER — INFLUENZA VAC SPLIT QUAD 0.5 ML IM SUSP
0.5000 mL | INTRAMUSCULAR | Status: AC
  Administered 2012-12-31: 0.5 mL via INTRAMUSCULAR
  Filled 2012-12-30: qty 0.5

## 2012-12-30 MED ORDER — ENOXAPARIN SODIUM 40 MG/0.4ML ~~LOC~~ SOLN
40.0000 mg | SUBCUTANEOUS | Status: DC
  Administered 2012-12-31: 40 mg via SUBCUTANEOUS
  Filled 2012-12-30 (×3): qty 0.4

## 2012-12-30 NOTE — H&P (Signed)
[facility] History and Physical  MONISH HALIBURTON ZOX:[id number] DOB: August 14, 1960 DOA: 12/30/2012  Referring physician:  EDP PCP: Lenora Boys, MD  Specialists:   Chief Complaint: Nearly Passed Out  HPI: Micheal Hunter is a 52 y.o. male who was seeing his PCP for a routine visit and began to feel dizzy and felt as if he was going to pass out.   He reported that prior to this event he felt  Tightness in his shoulders and had a headache.  He report in the past when he has that feeling he will pass out.   He denies having any chest pain.   When he did have the syncopal event before he lost his urine as well.   When he was evaluated in the office he was found to be hypotensive and tachycardic and he was sent to the ED.      Review of Systems: The patient denies anorexia, fever, chills, weight loss, vision loss, diplopia, decreased hearing, rhinitis, hoarseness, chest pain, dyspnea on exertion, peripheral edema, balance deficits, cough, hemoptysis, abdominal pain, nausea, vomiting, diarrhea, constipation, hematemesis, melena, hematochezia, severe indigestion/heartburn, dysuria, hematuria, incontinence, muscle weakness, suspicious skin lesions, transient blindness, difficulty walking, depression, unusual weight change, abnormal bleeding, enlarged lymph nodes, angioedema, and breast masses.    Past Medical History  Diagnosis Date  . Hypertension   . Restless leg   . Anxiety   . Paresthesia   . Sleep disturbance     Past Surgical History  Procedure Laterality Date  . Total hip arthroplasty Right   . Knee reconstruction Right     Prior to Admission medications   Medication Sig Start Date End Date Taking? Authorizing Provider  clonazePAM (KLONOPIN) 0.5 MG tablet Take 0.5 mg by mouth 2 (two) times daily as needed for anxiety.    Yes Historical Provider, MD  lisinopril (PRINIVIL,ZESTRIL) 10 MG tablet Take 10 mg by mouth daily.   Yes Historical Provider, MD  sertraline (ZOLOFT) 50 MG  tablet Take 50 mg by mouth daily.   Yes Historical Provider, MD  zolpidem (AMBIEN) 10 MG tablet Take 10 mg by mouth at bedtime as needed for sleep.   Yes Historical Provider, MD     No Known Allergies    Social History:  reports that he has never smoked. He has never used smokeless tobacco. He reports that he drinks about 6.0 ounces of alcohol per week. He reports that he does not use illicit drugs.     Family History:     Mother had CAD, DVTs, and HTN   Physical Exam:  GEN:  Pleasant Morbidly Obese  52 y.o. Caucasian  male examined  and in no acute distress; cooperative with exam Filed Vitals:   12/30/12 1417 12/30/12 1633 12/30/12 1745 12/30/12 1929  BP: 102/60 113/65 89/70 121/86  Pulse: 92 93 90 94  Temp: 97.7 F (36.5 C) 98 F (36.7 C) 98.2 F (36.8 C) 97.5 F (36.4 C)  TempSrc: Oral Oral Oral Oral  Resp: 20 20 18 18   Height: 6' (1.829 m)   6' (1.829 m)  Weight: 141.976 kg (313 lb)   136.487 kg (300 lb 14.4 oz)  SpO2: 98% 98% 99% 96%   Blood pressure 121/86, pulse 94, temperature 97.5 F (36.4 C), temperature source Oral, resp. rate 18, height 6' (1.829 m), weight 136.487 kg (300 lb 14.4 oz), SpO2 96.00%. PSYCH: He is alert and oriented x4; does not appear anxious does not appear depressed; affect is normal HEENT:  Normocephalic and Atraumatic, Mucous membranes pink; PERRLA; EOM intact; Fundi:  Benign;  No scleral icterus, Nares: Patent, Oropharynx: Clear,  Fair Dentition, Neck:  FROM, no cervical lymphadenopathy nor thyromegaly or carotid bruit; no JVD; Breasts:: Not examined CHEST WALL: No tenderness CHEST: Normal respiration, clear to auscultation bilaterally HEART: Regular rate and rhythm; no murmurs rubs or gallops BACK: No kyphosis or scoliosis; no CVA tenderness ABDOMEN: Positive Bowel Sounds, Obese, soft non-tender; no masses, no organomegaly. Rectal Exam: Not done EXTREMITIES: No cyanosis, clubbing or edema; no ulcerations. Genitalia: not examined PULSES: 2+  and symmetric SKIN: Normal hydration no rash or ulceration CNS: Cranial nerves 2-12 grossly intact no focal neurologic deficit    Labs on Admission:  Basic Metabolic Panel:  Recent Labs Lab 12/30/12 1450  NA 133*  K 4.3  CL 93*  CO2 21  GLUCOSE 108*  BUN 7  CREATININE 1.30  CALCIUM 9.7   Liver Function Tests:  Recent Labs Lab 12/30/12 1450  AST 143*  ALT 114*  ALKPHOS 93  BILITOT 0.6  PROT 8.3  ALBUMIN 4.2   No results found for this basename: LIPASE, AMYLASE,  in the last 168 hours No results found for this basename: AMMONIA,  in the last 168 hours CBC:  Recent Labs Lab 12/30/12 1450  WBC 5.2  NEUTROABS 3.1  HGB 16.1  HCT 45.1  MCV 99.1  PLT 108*   Cardiac Enzymes:  Recent Labs Lab 12/30/12 1450 12/30/12 1808  TROPONINI <0.30 <0.30    BNP (last 3 results) No results found for this basename: PROBNP,  in the last 8760 hours CBG: No results found for this basename: GLUCAP,  in the last 168 hours  Radiological Exams on Admission: Dg Chest 2 View  12/30/2012   CLINICAL DATA:  Shortness of breath  EXAM: CHEST  2 VIEW  COMPARISON:  11/03/2012  FINDINGS: The heart size and mediastinal contours are within normal limits. Both lungs are clear. The visualized skeletal structures are unremarkable.  IMPRESSION: No active cardiopulmonary disease.   Electronically Signed   By: Alcide Clever M.D.   On: 12/30/2012 15:14     EKG: Independently reviewed.    Assessment/Plan Principal Problem:   Pre-syncope Active Problems:   Hypertension   Hyponatremia   Morbid obesity   Unspecified hereditary and idiopathic peripheral neuropathy     1.   Pre-syncope-  Admitted to telemetry for cardiac monitoring to evalauate for possible arrhythmia, and Etiologies include cardiac versus neurologic versus Orthostasis.   Orthostatic vitals have been    ordered q 8 hours, and IVFs are ordered for hydration.  The events may be due to Vasovagal events and he may need further  evaluation by Neurology.     2.   Hypertension-  Was initially hypotensive, but BP rebounded.  Monitor BP trend.    3.   Hyponatremia- Mild, IVFs for rehydration.    4.    Peripheral Neuropathy-  5.  Morbid Obesity- Needs to Lose Weight.     6.  DVT prophylaxis with Lovenox.        Code Status:      FULL CODE Family Communication:    No Family present Disposition Plan:    Observation   Time spent:  51 Minutes  Ron Parker [facility] Pager [phone]  If 7PM-7AM, please contact night-coverage [email or web address] Password [facility] 12/30/2012, 9:16 PM

## 2012-12-30 NOTE — ED Notes (Addendum)
Was at Dr Pablo Lawrence office for follow up on BP. While leaving he got dizzy and felt like he was going to pass out. He was told to come here for further evaluation. Alert. States he had a similar episode a month ago when he passed out and was diagnosed with dehydration. States he has not eaten in 2 days. Does not feel hungry.

## 2012-12-30 NOTE — ED Provider Notes (Signed)
CSN: [id number]     Arrival date & time 12/30/12  1401 History   First MD Initiated Contact with Patient 12/30/12 1435     Chief Complaint  Patient presents with  . Dizziness   (Consider location/radiation/quality/duration/timing/severity/associated sxs/prior Treatment) Patient is a 52 y.o. male presenting with dizziness. The history is provided by the patient. No language interpreter was used.  Dizziness Quality:  Lightheadedness Severity:  Severe Onset quality:  Sudden Duration:  2 hours Progression:  Resolved Chronicity:  Recurrent Relieved by:  Nothing Worsened by:  Nothing tried Ineffective treatments:  None tried Associated symptoms: nausea   Associated symptoms: no chest pain and no shortness of breath   Risk factors: no anemia and no hx of stroke    Pt was at Dr. Dalbert Mayotte office today and felt like he was going to pass out.  His blood pressure was low and he was sent here for evaluation.   Bp was 80/50 in office.  Blood pressure has normalized now and he feels better Past Medical History  Diagnosis Date  . Hypertension   . Restless leg   . Anxiety   . Paresthesia   . Sleep disturbance    Past Surgical History  Procedure Laterality Date  . Total hip arthroplasty Right   . Knee reconstruction Right    No family history on file. History  Substance Use Topics  . Smoking status: Never Smoker   . Smokeless tobacco: Never Used  . Alcohol Use: 6.0 oz/week    10 Cans of beer per week     Comment: I have a couple of beers a day    Review of Systems  Respiratory: Negative for shortness of breath.   Cardiovascular: Negative for chest pain.  Gastrointestinal: Positive for nausea.  Neurological: Positive for dizziness.  All other systems reviewed and are negative.    Allergies  Review of patient's allergies indicates no known allergies.  Home Medications   Current Outpatient Rx  Name  Route  Sig  Dispense  Refill  . clonazePAM (KLONOPIN) 0.5 MG tablet   Oral   Take 0.5 mg by mouth 2 (two) times daily.         Marland Kitchen lisinopril (PRINIVIL,ZESTRIL) 10 MG tablet   Oral   Take 10 mg by mouth daily.         Marland Kitchen lisinopril (PRINIVIL,ZESTRIL) 5 MG tablet   Oral   Take 5 mg by mouth daily.         . sertraline (ZOLOFT) 50 MG tablet   Oral   Take 50 mg by mouth daily.         . sodium chloride (OCEAN) 0.65 % nasal spray   Nasal   Place 1 spray into the nose as needed for congestion.         . Triamcinolone Acetonide (NASACORT AQ NA)   Nasal   Place into the nose.         . zolpidem (AMBIEN) 10 MG tablet   Oral   Take 10 mg by mouth at bedtime as needed for sleep.          BP 102/60  Pulse 92  Temp(Src) 97.7 F (36.5 C) (Oral)  Resp 20  Ht 6' (1.829 m)  Wt 313 lb (141.976 kg)  BMI 42.44 kg/m2  SpO2 98% Physical Exam  Nursing note and vitals reviewed. Constitutional: He is oriented to person, place, and time. He appears well-developed and well-nourished.  HENT:  Head: Normocephalic and atraumatic.  Right  Ear: External ear normal.  Left Ear: External ear normal.  Eyes: EOM are normal. Pupils are equal, round, and reactive to light.  Neck: Normal range of motion.  Cardiovascular: Normal rate and normal heart sounds.   Pulmonary/Chest: Effort normal.  Abdominal: Soft. He exhibits no distension. There is no tenderness.  Musculoskeletal: Normal range of motion.  Neurological: He is alert and oriented to person, place, and time.  Skin: Skin is warm.  Psychiatric: He has a normal mood and affect.    ED Course  Procedures (including critical care time) Labs Review Labs Reviewed  CBC WITH DIFFERENTIAL - Abnormal; Notable for the following:    MCH 35.4 (*)    Platelets 108 (*)    Monocytes Relative 14 (*)    All other components within normal limits  COMPREHENSIVE METABOLIC PANEL - Abnormal; Notable for the following:    Sodium 133 (*)    Chloride 93 (*)    Glucose, Bld 108 (*)    AST 143 (*)    ALT 114 (*)    GFR  calc non Af Amer 61 (*)    GFR calc Af Amer 71 (*)    All other components within normal limits  TROPONIN I   Imaging Review Dg Chest 2 View  12/30/2012   CLINICAL DATA:  Shortness of breath  EXAM: CHEST  2 VIEW  COMPARISON:  11/03/2012  FINDINGS: The heart size and mediastinal contours are within normal limits. Both lungs are clear. The visualized skeletal structures are unremarkable.  IMPRESSION: No active cardiopulmonary disease.   Electronically Signed   By: Alcide Clever M.D.   On: 12/30/2012 15:14    EKG Interpretation    Date/Time:  Friday December 30 2012 14:54:53 EST Ventricular Rate:  72 PR Interval:  168 QRS Duration: 96 QT Interval:  380 QTC Calculation: 416 R Axis:   -11 Text Interpretation:  Normal sinus rhythm Normal ECG No significant change since last tracing Confirmed by Ethelda Chick  MD, SAM ([id number]) on 12/30/2012 3:00:19 PM          labs returned and reviewed,   Pt had episode of nausea  Heart rate increased to 140 and pt became sweaty.   Pt had returned to normal rate before repeat EKG.   Pt given zofran for nausea.   Monitor strip reviewed and rate up to 140's.     MDM   1. Near syncope   2. Hypotension        [name], PA-C 12/30/12 1705

## 2012-12-31 DIAGNOSIS — I959 Hypotension, unspecified: Secondary | ICD-10-CM

## 2012-12-31 DIAGNOSIS — R209 Unspecified disturbances of skin sensation: Secondary | ICD-10-CM

## 2012-12-31 LAB — CBC
HCT: 41.7 % (ref 39.0–52.0)
Hemoglobin: 14.5 g/dL (ref 13.0–17.0)
MCHC: 34.8 g/dL (ref 30.0–36.0)
MCV: 102.5 fL — ABNORMAL HIGH (ref 78.0–100.0)
RBC: 4.07 MIL/uL — ABNORMAL LOW (ref 4.22–5.81)

## 2012-12-31 LAB — BASIC METABOLIC PANEL
BUN: 12 mg/dL (ref 6–23)
GFR calc Af Amer: 82 mL/min — ABNORMAL LOW (ref >90)
GFR calc non Af Amer: 71 mL/min — ABNORMAL LOW (ref >90)
Potassium: 4.9 mEq/L (ref 3.5–5.1)
Sodium: 135 mEq/L (ref 135–145)

## 2012-12-31 MED ORDER — GABAPENTIN 300 MG PO CAPS: 300.0000 mg | ORAL_CAPSULE | Freq: Three times a day (TID) | ORAL | Status: AC

## 2012-12-31 NOTE — Progress Notes (Signed)
Reviewed discharge summary, follow up appointments, and medications. Took IV out and answered questions. Patient discharge to home. Lajuana Matte, RN

## 2012-12-31 NOTE — Discharge Summary (Signed)
Physician Discharge Summary  Micheal Hunter ZOX:[id number] DOB: Jun 01, 1960 DOA: 12/30/2012  PCP: Lenora Boys, MD  Admit date: 12/30/2012 Discharge date: 12/31/2012  Time spent: >30 minutes  Recommendations for Outpatient Follow-up:  1. BMET to follow electrolytes and renal function 2. Patient discharged on neurontin (dose adjusted for weight) for neuropathy and will need close follow up for nerve studies with neurology as planned prior to admission 3. Limit the use of polypharmacy that can contribute to syncope 4. Arrange visit with cardiology for event monitor testing 5. Check B12 6. Patient will also need sleep study  Discharge Diagnoses:  Principal Problem:   Pre-syncope Active Problems:   Morbid obesity   Unspecified hereditary and idiopathic peripheral neuropathy   Hypertension   Hyponatremia   Discharge Condition: stable and improved. Will follow with PCP in 1 week. Antihypertensive agents discontinued due to orthostasis and pre-syncope. Started on neurontin for neuropathy.  Diet recommendation: heart healthy low sodium diet and low calorie.  Filed Weights   12/30/12 1417 12/30/12 1929  Weight: 141.976 kg (313 lb) 136.487 kg (300 lb 14.4 oz)    History of present illness:  52 y.o. male who was seeing his PCP for a routine visit and began to feel dizzy and felt as if he was going to pass out. He reported that prior to this event he felt Tightness in his shoulders and had a headache. He report in the past when he has that feeling he will pass out. He denies having any chest pain. When he did have the syncopal event before he lost his urine as well. When he was evaluated in the office he was found to be hypotensive and tachycardic and he was sent to the ED.   Hospital Course:  1. Pre-syncope- appears to be secondary to orthostatic changes, due to mild dehydration, antihypertensive agents and polypharmacy (zoloft, lyrica, klonopin) -no abnormalities seen on EKG or  telemetry -will benefit of event monitor in outpatient setting -patient zoloft and lyrica has been discontinued -lisinopril also stopped -after IVF's given electrolytes where repleted and patient no longer orthostatic -will follow with neurology and PCP in outpatient setting  2. Hypertension- Was initially hypotensive, but BP rebounded with IVF's. -remained stable w/o medications -lisinopril discontinued -will advised to follow a low sodium diet   3. Hyponatremia- resolved with IVFs; most likely due to mild dehydration   4. Peripheral Neuropathy- started on neurontin. Dose adjusted for weight. -outpatient follow up with neurology for further evaluation and treatment  5. Morbid Obesity- Needs to Lose Weight.  -will recommend sleep study   6. Anxiety: continue klonopin    Procedures:  See below for x-ray reports   Consultations:  None   Discharge Exam: Filed Vitals:   12/31/12 0826  BP: 147/81  Pulse: 66  Temp: 98 F (36.7 C)  Resp: 20    General: AAOX3, NAD, afebrile, denies dizziness, CP or SOB Cardiovascular: S1 and S2, no rubs, no gallops and no murmur Respiratory: CTA bilaterally Abdomen: soft, NT, ND, positive BS Neurologic exam: patient reports numbness/tingling on Le's affecting mainly his feet; otherwise non focal.  Discharge Instructions  Discharge Orders   Future Orders Complete By Expires   Discharge instructions  As directed    Comments:     Follow a low calorie diet and lose weight Please follow with PCP in 1 week Continue follow up with neurology for nerve studies and sleep study as previously indicated Take medications as prescribed Follow a low sodium diet Keep  yourself well hydrated       Medication List    STOP taking these medications       lisinopril 10 MG tablet  Commonly known as:  PRINIVIL,ZESTRIL     sertraline 50 MG tablet  Commonly known as:  ZOLOFT      TAKE these medications       clonazePAM 0.5 MG tablet   Commonly known as:  KLONOPIN  Take 0.5 mg by mouth 2 (two) times daily as needed for anxiety.     gabapentin 300 MG capsule  Commonly known as:  NEURONTIN  Take 1 capsule (300 mg total) by mouth 3 (three) times daily.     zolpidem 10 MG tablet  Commonly known as:  AMBIEN  Take 10 mg by mouth at bedtime as needed for sleep.       No Known Allergies     Follow-up Information   Follow up with FRIED, ROBERT L, MD. Schedule an appointment as soon as possible for a visit in 1 week.   Specialty:  Family Medicine   Contact information:   [address] [place] [address] [phone]        The results of significant diagnostics from this hospitalization (including imaging, microbiology, ancillary and laboratory) are listed below for reference.    Significant Diagnostic Studies: Dg Chest 2 View  12/30/2012   CLINICAL DATA:  Shortness of breath  EXAM: CHEST  2 VIEW  COMPARISON:  11/03/2012  FINDINGS: The heart size and mediastinal contours are within normal limits. Both lungs are clear. The visualized skeletal structures are unremarkable.  IMPRESSION: No active cardiopulmonary disease.   Electronically Signed   By: Alcide Clever M.D.   On: 12/30/2012 15:14    Microbiology: No results found for this or any previous visit (from the past 240 hour(s)).   Labs: Basic Metabolic Panel:  Recent Labs Lab 12/30/12 1450 12/31/12 0515  NA 133* 135  K 4.3 4.9  CL 93* 96  CO2 21 30  GLUCOSE 108* 106*  BUN 7 12  CREATININE 1.30 1.15  CALCIUM 9.7 9.0   Liver Function Tests:  Recent Labs Lab 12/30/12 1450  AST 143*  ALT 114*  ALKPHOS 93  BILITOT 0.6  PROT 8.3  ALBUMIN 4.2   CBC:  Recent Labs Lab 12/30/12 1450 12/31/12 0515  WBC 5.2 3.3*  NEUTROABS 3.1  --   HGB 16.1 14.5  HCT 45.1 41.7  MCV 99.1 102.5*  PLT 108* 84*   Cardiac Enzymes:  Recent Labs Lab 12/30/12 1450 12/30/12 1808  TROPONINI <0.30 <0.30    Signed:  Ebone Alcivar  [facility] 12/31/2012, 11:37 AM

## 2012-12-31 NOTE — Progress Notes (Signed)
Utilization Review completed.  

## 2013-01-01 NOTE — ED Provider Notes (Signed)
Medical screening examination/treatment/procedure(s) were performed by non-physician practitioner and as supervising physician I was immediately available for consultation/collaboration.  EKG Interpretation    Date/Time:  Friday December 30 2012 14:54:53 EST Ventricular Rate:  72 PR Interval:  168 QRS Duration: 96 QT Interval:  380 QTC Calculation: 416 R Axis:   -11 Text Interpretation:  Normal sinus rhythm Normal ECG No significant change since last tracing Confirmed by Ethelda Chick  MD, SAM ([id number]) on 12/30/2012 3:00:19 PM              Gilda Crease, MD 01/01/13 1054

## 2013-01-20 ENCOUNTER — Other Ambulatory Visit: Payer: Self-pay | Admitting: Gastroenterology

## 2013-01-20 DIAGNOSIS — R7989 Other specified abnormal findings of blood chemistry: Secondary | ICD-10-CM

## 2013-01-20 DIAGNOSIS — R945 Abnormal results of liver function studies: Principal | ICD-10-CM

## 2013-01-26 ENCOUNTER — Ambulatory Visit
Admission: RE | Admit: 2013-01-26 | Discharge: 2013-01-26 | Disposition: A | Discharge status: Home / Self Care | Payer: Medicare Other | Source: Ambulatory Visit | Attending: Gastroenterology | Admitting: Gastroenterology

## 2013-01-26 DIAGNOSIS — R945 Abnormal results of liver function studies: Principal | ICD-10-CM

## 2013-01-26 DIAGNOSIS — R7989 Other specified abnormal findings of blood chemistry: Secondary | ICD-10-CM

## 2013-01-26 MED ORDER — IOHEXOL 300 MG/ML  SOLN
125.0000 mL | Freq: Once | INTRAMUSCULAR | Status: AC | PRN
  Administered 2013-01-26: 125 mL via INTRAVENOUS

## 2013-01-30 NOTE — Telephone Encounter (Signed)
Encounter complete. 

## 2013-01-31 ENCOUNTER — Ambulatory Visit: Payer: Medicare Other | Admitting: Interventional Cardiology

## 2013-07-06 ENCOUNTER — Encounter: Payer: Medicare Other | Admitting: Neurology

## 2013-07-07 ENCOUNTER — Encounter: Payer: Medicare Other | Admitting: Neurology

## 2013-07-24 ENCOUNTER — Telehealth: Payer: Self-pay

## 2013-07-24 NOTE — Telephone Encounter (Signed)
Patient died @ [facility] per Obituary °

## 2013-08-12 DEATH — deceased

## 2014-04-30 IMAGING — CT CT ABDOMEN W/ CM
3 of 5 series · 13 of 36 positions shown, 19 images · IV contrast (READICAT/WATER & [ID] OMNI [id number])
Comparison: Abdominal ultrasound 12/18/2009

CLINICAL DATA: Elevated liver function tests.

EXAM:
CT ABDOMEN WITH CONTRAST
TECHNIQUE: Multidetector CT imaging of the abdomen was performed using the
standard protocol following bolus administration of intravenous
contrast.
CONTRAST:  125mL OMNIPAQUE IOHEXOL 300 MG/ML  SOLN

[Series 3: abdomen with · axial · 0.98mm/px · z∈[-221,+9]mm · 5 of 70 slices shown, 10 images]
[im 12/70  soft-tissue]
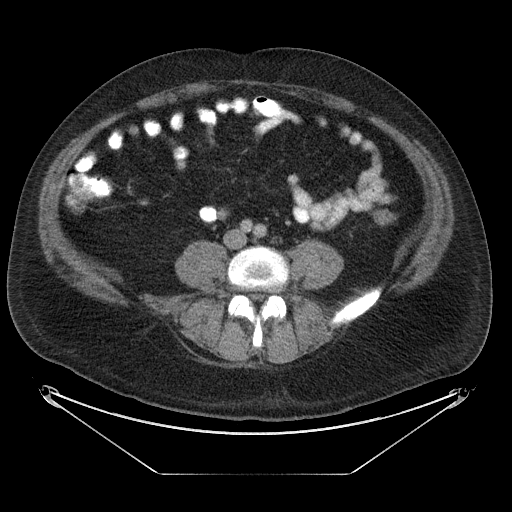
[im 12/70  bone]
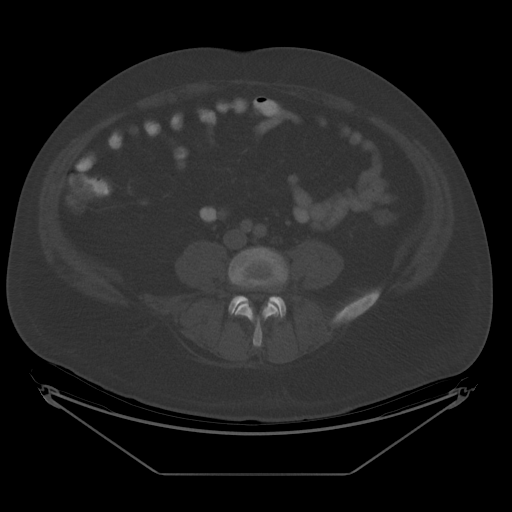
[im 24/70  soft-tissue]
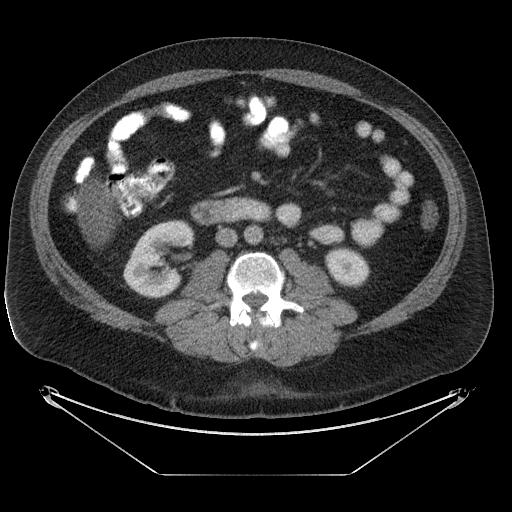
[im 24/70  lung]
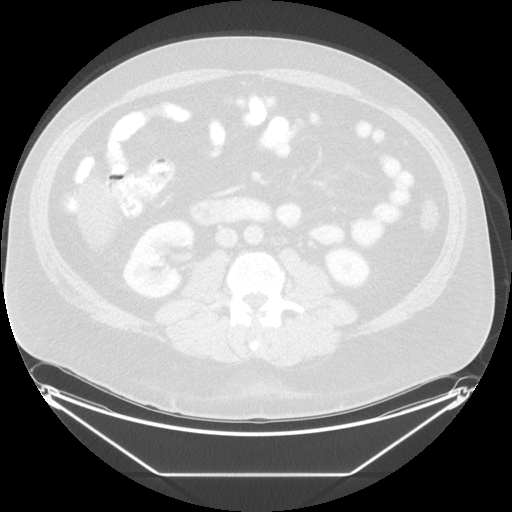
[im 35/70  soft-tissue]
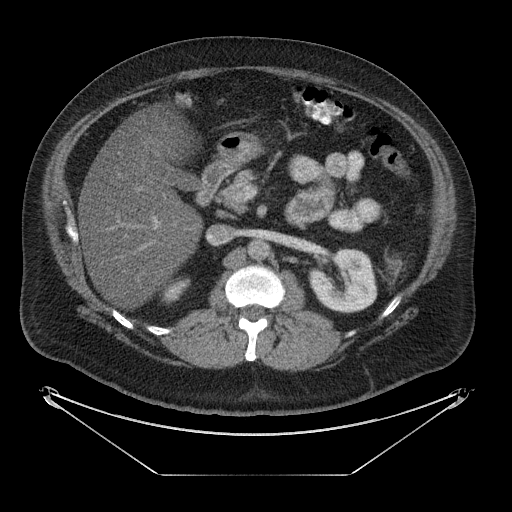
[im 35/70  lung]
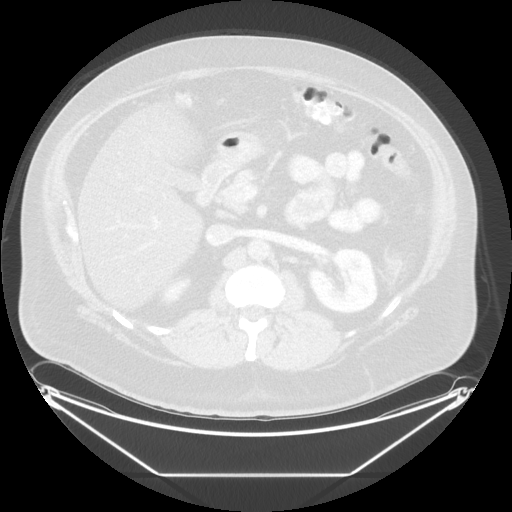
[im 47/70  soft-tissue]
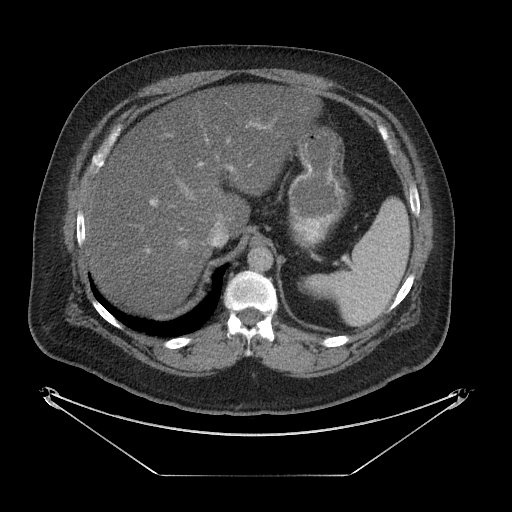
[im 47/70  lung]
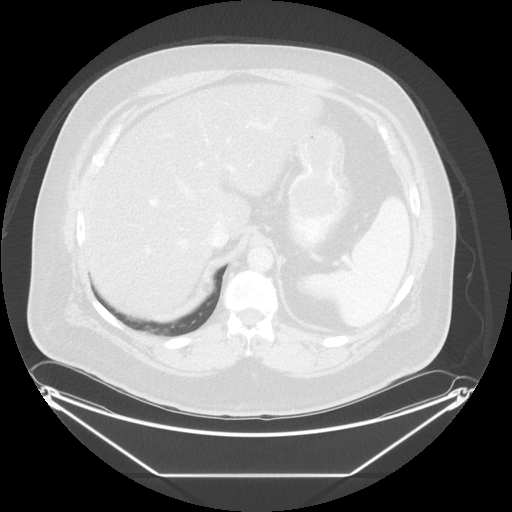
[im 58/70  soft-tissue]
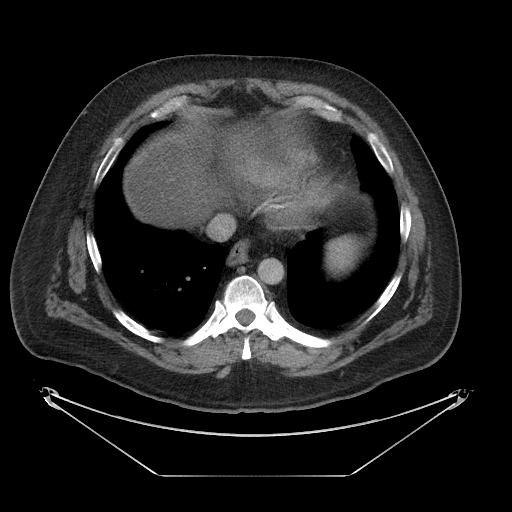
[im 58/70  lung]
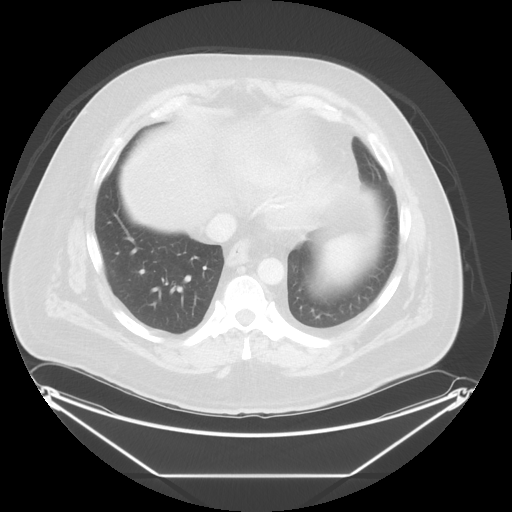

[Series 601: coronal body · coronal · 0.98mm/px · 1 of 154 slices shown, 2 images]
[im 52/154  soft-tissue]
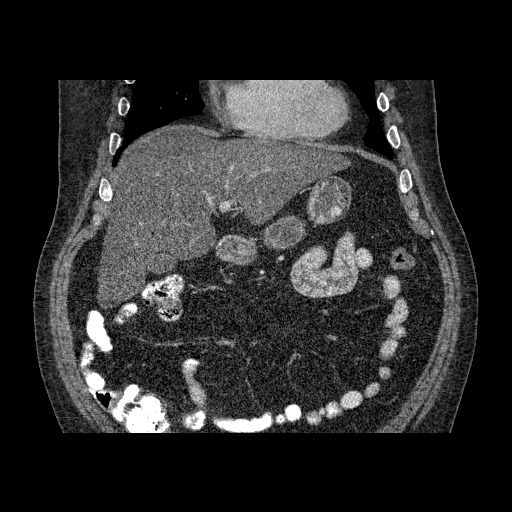
[im 52/154  bone]
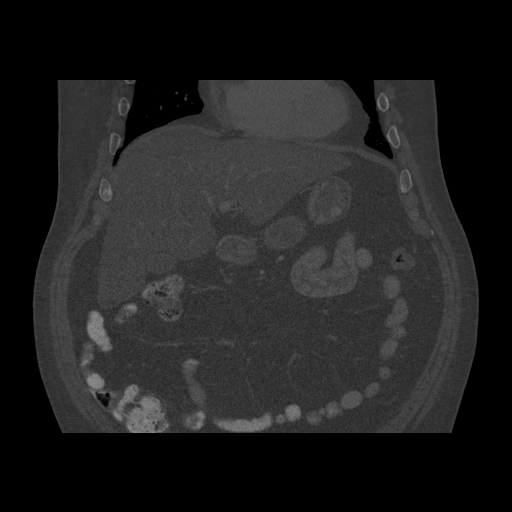

[Series 602: sagittal body · sagittal · 0.98mm/px · 7 of 194 slices shown]
[im 22/194  soft-tissue]
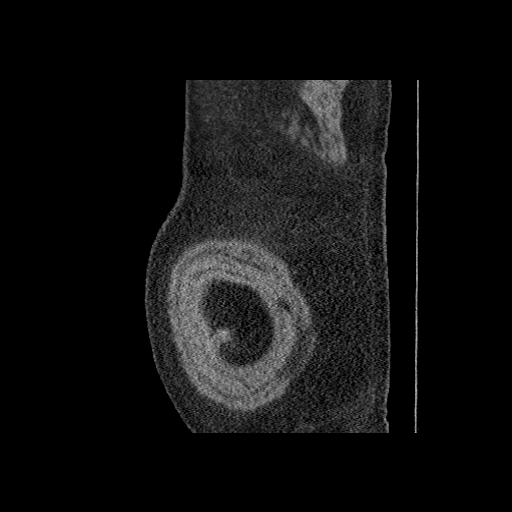
[im 43/194  soft-tissue]
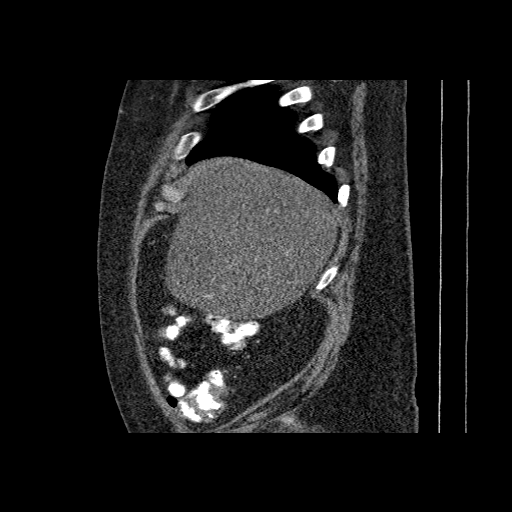
[im 65/194  soft-tissue]
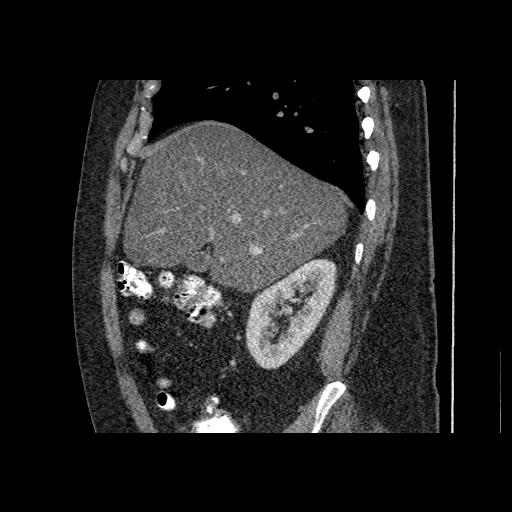
[im 86/194  soft-tissue]
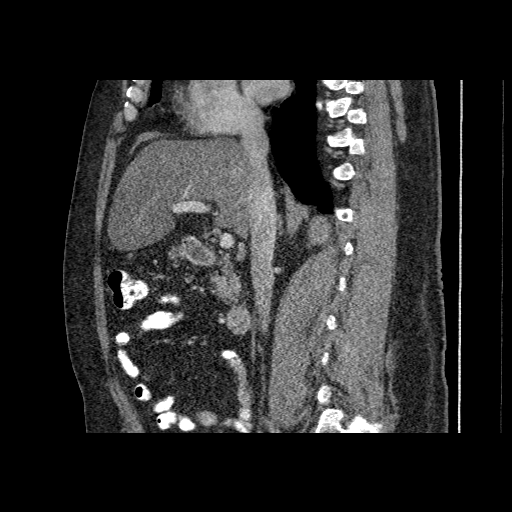
[im 108/194  soft-tissue]
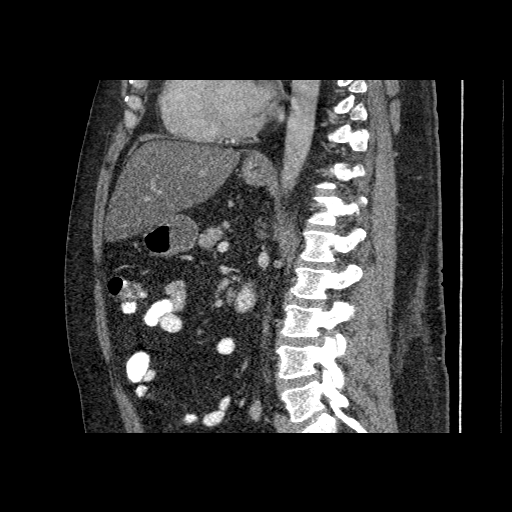
[im 129/194  soft-tissue]
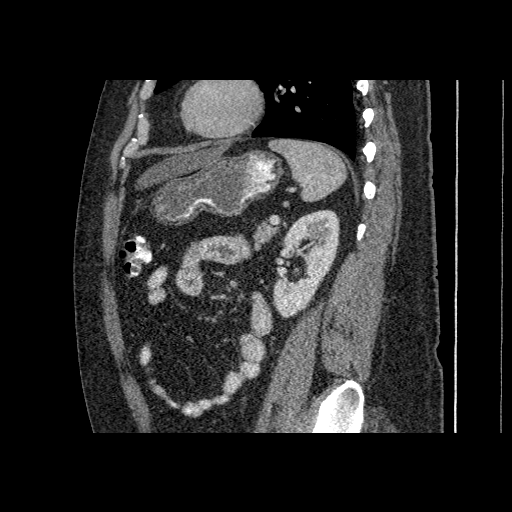
[im 151/194  soft-tissue]
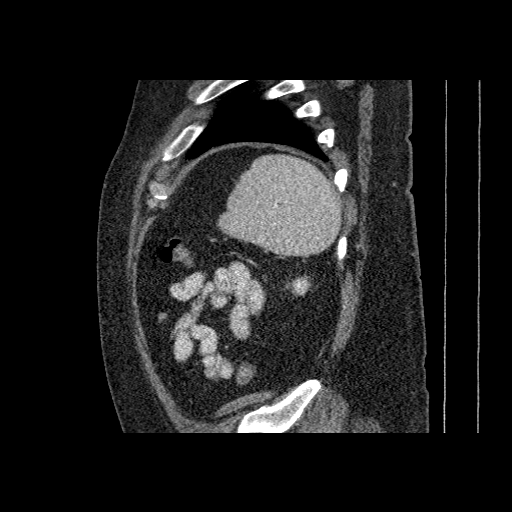

[13 of 36 positions shown; findings below may reference images not displayed]

FINDINGS: Two 3 mm nodules are present in the right middle lobe (series 4,
images 1 and 2). There is no pleural effusion.

There is prominent diffusely decreased attenuation of the liver,
compatible with hepatic steatosis. The liver is enlarged. No focal
liver lesion is identified. The gallbladder, spleen, adrenal glands,
kidneys, and pancreas have an unremarkable enhanced appearance.

There is likely a small sliding hiatal hernia. Oral contrast is
partially visualized and loops of small large bowel, which are non
dilated. No free fluid or enlarged lymph nodes are identified in the
abdomen. There is no intraperitoneal free air. Osseous structures
are unremarkable.
IMPRESSION: 1. Enlarged, fatty liver. No focal liver lesion or biliary
dilatation seen.
2. Two 3 mm nodules in the right middle lobe. If the patient is at
high risk for bronchogenic carcinoma, follow-up chest CT at 1 year
is recommended. If the patient is at low risk, no follow-up is
needed. This recommendation follows the consensus statement:
Guidelines for Management of Small Pulmonary Nodules Detected on CT
Scans: A Statement from the [HOSPITAL] as published in
